# Patient Record
Sex: Male | Born: 1940 | Race: White | Hispanic: No
Health system: Southern US, Community
[De-identification: ages and names within clinical notes are randomized; demographics above are authoritative.]

---

## 2020-04-10 ENCOUNTER — Emergency Department (HOSPITAL_COMMUNITY): Payer: Medicare Other

## 2020-04-10 ENCOUNTER — Other Ambulatory Visit: Payer: Self-pay

## 2020-04-10 ENCOUNTER — Encounter (HOSPITAL_COMMUNITY): Payer: Self-pay | Admitting: Emergency Medicine

## 2020-04-10 ENCOUNTER — Emergency Department (HOSPITAL_COMMUNITY)
Admission: EM | Admit: 2020-04-10 | Discharge: 2020-04-10 | Disposition: A | Discharge status: Home / Self Care | Payer: Medicare Other | Attending: Emergency Medicine | Admitting: Emergency Medicine

## 2020-04-10 ENCOUNTER — Ambulatory Visit (HOSPITAL_COMMUNITY)
Admission: EM | Admit: 2020-04-10 | Discharge: 2020-04-10 | Disposition: A | Discharge status: Other Acute Inpatient Hospital | Payer: Medicare Other

## 2020-04-10 DIAGNOSIS — F0390 Unspecified dementia without behavioral disturbance: Secondary | ICD-10-CM

## 2020-04-10 DIAGNOSIS — J40 Bronchitis, not specified as acute or chronic: Secondary | ICD-10-CM | POA: Diagnosis not present

## 2020-04-10 DIAGNOSIS — Z20822 Contact with and (suspected) exposure to covid-19: Secondary | ICD-10-CM | POA: Insufficient documentation

## 2020-04-10 DIAGNOSIS — F0391 Unspecified dementia with behavioral disturbance: Secondary | ICD-10-CM | POA: Insufficient documentation

## 2020-04-10 DIAGNOSIS — R062 Wheezing: Secondary | ICD-10-CM | POA: Insufficient documentation

## 2020-04-10 DIAGNOSIS — R451 Restlessness and agitation: Secondary | ICD-10-CM | POA: Diagnosis present

## 2020-04-10 LAB — URINALYSIS, ROUTINE W REFLEX MICROSCOPIC
Bacteria, UA: NONE SEEN
Bilirubin Urine: NEGATIVE
Glucose, UA: NEGATIVE mg/dL
Ketones, ur: NEGATIVE mg/dL
Leukocytes,Ua: NEGATIVE
Nitrite: NEGATIVE
Protein, ur: NEGATIVE mg/dL
Specific Gravity, Urine: 1.011 (ref 1.005–1.030)
pH: 5 (ref 5.0–8.0)

## 2020-04-10 LAB — CBC
HCT: 43.7 % (ref 39.0–52.0)
Hemoglobin: 14.6 g/dL (ref 13.0–17.0)
MCH: 30.8 pg (ref 26.0–34.0)
MCHC: 33.4 g/dL (ref 30.0–36.0)
MCV: 92.2 fL (ref 80.0–100.0)
Platelets: 189 10*3/uL (ref 150–400)
RBC: 4.74 MIL/uL (ref 4.22–5.81)
RDW: 12.3 % (ref 11.5–15.5)
WBC: 8.7 10*3/uL (ref 4.0–10.5)
nRBC: 0 % (ref 0.0–0.2)

## 2020-04-10 LAB — COMPREHENSIVE METABOLIC PANEL
ALT: 19 U/L (ref 0–44)
AST: 22 U/L (ref 15–41)
Albumin: 4 g/dL (ref 3.5–5.0)
Alkaline Phosphatase: 39 U/L (ref 38–126)
Anion gap: 10 (ref 5–15)
BUN: 18 mg/dL (ref 8–23)
CO2: 24 mmol/L (ref 22–32)
Calcium: 9.1 mg/dL (ref 8.9–10.3)
Chloride: 102 mmol/L (ref 98–111)
Creatinine, Ser: 1.42 mg/dL — ABNORMAL HIGH (ref 0.61–1.24)
GFR calc Af Amer: 55 mL/min — ABNORMAL LOW (ref >60)
GFR calc non Af Amer: 48 mL/min — ABNORMAL LOW (ref >60)
Glucose, Bld: 109 mg/dL — ABNORMAL HIGH (ref 70–99)
Potassium: 3.9 mmol/L (ref 3.5–5.1)
Sodium: 136 mmol/L (ref 135–145)
Total Bilirubin: 1 mg/dL (ref 0.3–1.2)
Total Protein: 7.2 g/dL (ref 6.5–8.1)

## 2020-04-10 LAB — CBG MONITORING, ED: Glucose-Capillary: 108 mg/dL — ABNORMAL HIGH (ref 70–99)

## 2020-04-10 LAB — SARS CORONAVIRUS 2 BY RT PCR (HOSPITAL ORDER, PERFORMED IN ~~LOC~~ HOSPITAL LAB): SARS Coronavirus 2: NEGATIVE

## 2020-04-10 MED ORDER — AEROCHAMBER PLUS FLO-VU LARGE MISC: 1.0000 | Freq: Once | Status: AC

## 2020-04-10 MED ORDER — SODIUM CHLORIDE 0.9% FLUSH: 3.0000 mL | Freq: Once | INTRAVENOUS | Status: DC

## 2020-04-10 MED ORDER — ALBUTEROL SULFATE HFA 108 (90 BASE) MCG/ACT IN AERS
4.0000 | INHALATION_SPRAY | Freq: Once | RESPIRATORY_TRACT | Status: AC
  Administered 2020-04-10: 4 via RESPIRATORY_TRACT
  Filled 2020-04-10: qty 6.7

## 2020-04-10 MED ORDER — AEROCHAMBER PLUS FLO-VU LARGE MISC
Status: AC
  Administered 2020-04-10: 1
  Filled 2020-04-10: qty 1

## 2020-04-10 NOTE — ED Notes (Signed)
Patient transported to X-ray 

## 2020-04-10 NOTE — ED Provider Notes (Signed)
[facility] EMERGENCY DEPARTMENT Provider Note   CSN: [id number] Arrival date & time: 04/10/20  1336     History Chief Complaint  Patient presents with  . Altered Mental Status    Darren Smith is a 79 y.o. male.  Patient is a 79 year old male with a history of dementia who currently is living with his son presenting today with police.  Patient's son is also present and gives the history.  Son reports that for the last 1 week he has had upper respiratory congestion, cough and has been using Mucinex.  He has the same schedule every day and usually does pretty well.  However today he went out to check the mail and when he came back the door was broken and he was locked out of the house.  He started roaming up the street in the [place] man called the police.  The police brought him back to the house and his son met him there.  However the patient refused to go in the house.  Son reports he became more agitated and stated he did not want to go in the house.  The police asked if he wanted to go to the hospital and he agreed that he wanted to go to the hospital and he did not want to go back into the house.  Son states he has done this 1 other time where he has changed his routine and become agitated and became combative at that time with his son but he has not been aggressive today.  He has not had fever has been eating normally but did skip lunch today.  He has been taking his medications as prescribed.  Patient was seen by his [facility] provider last week and at the [place] for his worsening dementia which has progressed since his wife's sudden death in 09/20/22.  He has a appointment in November with a neurologist but the son does not understand why it so late.  He denies any nausea, vomiting.  The patient states he just does not feel well and he was scared to go in the house because his throat hurt.  He has no prior history of smoking, asthma or COPD.  He does not use inhalers at  home.  He did receive his Covid vaccine.  The history is provided by the patient.  Altered Mental Status Presenting symptoms: behavior changes   Severity:  Moderate Most recent episode:  Today Episode history:  Continuous Duration:  1 day Timing:  Constant Progression:  Unchanged Chronicity:  Recurrent Context: dementia   Associated symptoms: agitation        History reviewed. No pertinent past medical history.  There are no problems to display for this patient.      No family history on file.  Social History   Tobacco Use  . Smoking status: Not on file  Substance Use Topics  . Alcohol use: Not on file  . Drug use: Not on file    Home Medications Prior to Admission medications   Not on File    Allergies    Patient has no allergy information on record.  Review of Systems   Review of Systems  Psychiatric/Behavioral: Positive for agitation.  All other systems reviewed and are negative.   Physical Exam Updated Vital Signs BP (!) 175/109 (BP Location: Right Arm)   Pulse 89   Temp 98 F (36.7 C) (Oral)   Resp 15   SpO2 99%   Physical Exam Vitals and  nursing note reviewed.  Constitutional:      General: He is not in acute distress.    Appearance: Normal appearance. He is well-developed and normal weight.  HENT:     Head: Normocephalic and atraumatic.  Eyes:     Conjunctiva/sclera: Conjunctivae normal.     Pupils: Pupils are equal, round, and reactive to light.  Cardiovascular:     Rate and Rhythm: Normal rate and regular rhythm.     Pulses: Normal pulses.     Heart sounds: No murmur heard.   Pulmonary:     Effort: Pulmonary effort is normal. No respiratory distress.     Breath sounds: Wheezing present. No rales.  Abdominal:     General: There is no distension.     Palpations: Abdomen is soft.     Tenderness: There is no abdominal tenderness. There is no guarding or rebound.  Musculoskeletal:        General: No tenderness. Normal range of  motion.     Cervical back: Normal range of motion and neck supple.  Skin:    General: Skin is warm and dry.     Findings: No erythema or rash.  Neurological:     General: No focal deficit present.     Mental Status: He is alert. Mental status is at baseline.     Comments: Oriented to person and place but not to time   Psychiatric:     Comments: Calm and cooperative     ED Results / Procedures / Treatments   Labs (all labs ordered are listed, but only abnormal results are displayed) Labs Reviewed  COMPREHENSIVE METABOLIC PANEL - Abnormal; Notable for the following components:      Result Value   Glucose, Bld 109 (*)    Creatinine, Ser 1.42 (*)    GFR calc non Af Amer 48 (*)    GFR calc Af Amer 55 (*)    All other components within normal limits  URINALYSIS, ROUTINE W REFLEX MICROSCOPIC - Abnormal; Notable for the following components:   Hgb urine dipstick MODERATE (*)    All other components within normal limits  CBG MONITORING, ED - Abnormal; Notable for the following components:   Glucose-Capillary 108 (*)    All other components within normal limits  SARS CORONAVIRUS 2 BY RT PCR (HOSPITAL ORDER, [facility])  CBC    EKG None  Radiology DG Chest 2 View  Result Date: 04/10/2020 CLINICAL DATA:  Cough and congestion. EXAM: CHEST - 2 VIEW COMPARISON:  None. FINDINGS: Minimal bibasilar opacities likely reflect atelectasis. No pneumothorax or pleural effusion. Cardiomediastinal silhouette within normal limits. Aortic atherosclerotic calcifications. Multilevel spondylosis.  No acute osseous abnormality. IMPRESSION: Minimal bibasilar opacities likely reflect atelectasis. Electronically Signed   By: Primitivo Gauze M.D.   On: 04/10/2020 16:20    Procedures Procedures (including critical care time)  Medications Ordered in ED Medications  sodium chloride flush (NS) 0.9 % injection 3 mL (3 mLs Intravenous Not Given 04/10/20 1552)  albuterol (VENTOLIN  HFA) 108 (90 Base) MCG/ACT inhaler 4 puff (has no administration in time range)  AeroChamber Plus Flo-Vu Medium MISC 1 each (has no administration in time range)    ED Course  I have reviewed the triage vital signs and the nursing notes.  Pertinent labs & imaging results that were available during my care of the patient were reviewed by me and considered in my medical decision making (see chart for details).    MDM Rules/Calculators/A&P  Pt is a 78y/o male with hx of dementia coming today with agitation and recent URI sx.  Pt complains of not feeling good but son reports as above.  He has been receiving mucinex with some improvement.  COVID vaccinated.  Pt having worsening behavioral stuff today.  Pt is calm and cooperative here but son reports he doesn't want to go home.  Pt wheezing on exam but o/w well appearing.  VS shows O2 of 99% on RA and normal pulse but HTN.  Pt has not had home meds today.  Will give albuterol and get CXR/COVID.  Will monitor O2 sat. CBC and BMP unchanged from 1 week ago.  5:26 PM CXR without signs of PNA and after albuterol wheezing has resolved.  Sats remain normal.  Do not find a reason for admission today.  UA and COVID pending but pt now states he wants to go home.  Son asked about observation however feel it was be more detrimental than helpful.  Will have transitional care team come see the son and help with next steps.  Home health ordered.  5:43 PM COVID and UA without acute findings.  MDM Number of Diagnoses or Management Options   Amount and/or Complexity of Data Reviewed Clinical lab tests: ordered and reviewed Tests in the radiology section of CPT: ordered and reviewed Decide to obtain previous medical records or to obtain history from someone other than the patient: yes Obtain history from someone other than the patient: yes Review and summarize past medical records: yes Independent visualization of images, tracings, or  specimens: yes  Risk of Complications, Morbidity, and/or Mortality Presenting problems: moderate Diagnostic procedures: low Management options: low  Patient Progress Patient progress: stable     Final Clinical Impression(s) / ED Diagnoses Final diagnoses:  Bronchitis  Dementia without behavioral disturbance, unspecified dementia type [facility])    Rx / DC Orders ED Discharge Orders    None       Blanchie Dessert, MD 04/10/20 1743

## 2020-04-10 NOTE — ED Triage Notes (Signed)
Pt arrives via [employer] after leaving his home while his son in law was at work and he could not get back In house. Per son in law when he arrives home pt was refuses to go into house. Pt has hx of dementia and family reports this has been worsening since jan when pts wife passed. Pt seems agitated  will not answer any questions for me just says I want to leave.

## 2020-04-10 NOTE — ED Notes (Signed)
Discharge instructions discussed with caregiver Viviann Spare, son. Able to verbalized understanding with no questions at this time. Pt to go home with son

## 2020-04-10 NOTE — Discharge Instructions (Addendum)
Use the inhaler 2puffs with the spacer every 4-6 hours as needed for rattling cough and you can continue mucinex.  Return if you start running a fever, having shortness of breath, vomiting or having new chest or abdominal pain.  COVID test today was negative.

## 2020-04-10 NOTE — ED Notes (Signed)
CM at bedside

## 2021-05-30 IMAGING — CR DG CHEST 2V
2 series · 2 of 2 positions shown · non-contrast
Comparison: None.

CLINICAL DATA: Cough and congestion.

EXAM:
CHEST - 2 VIEW

[chest lat]
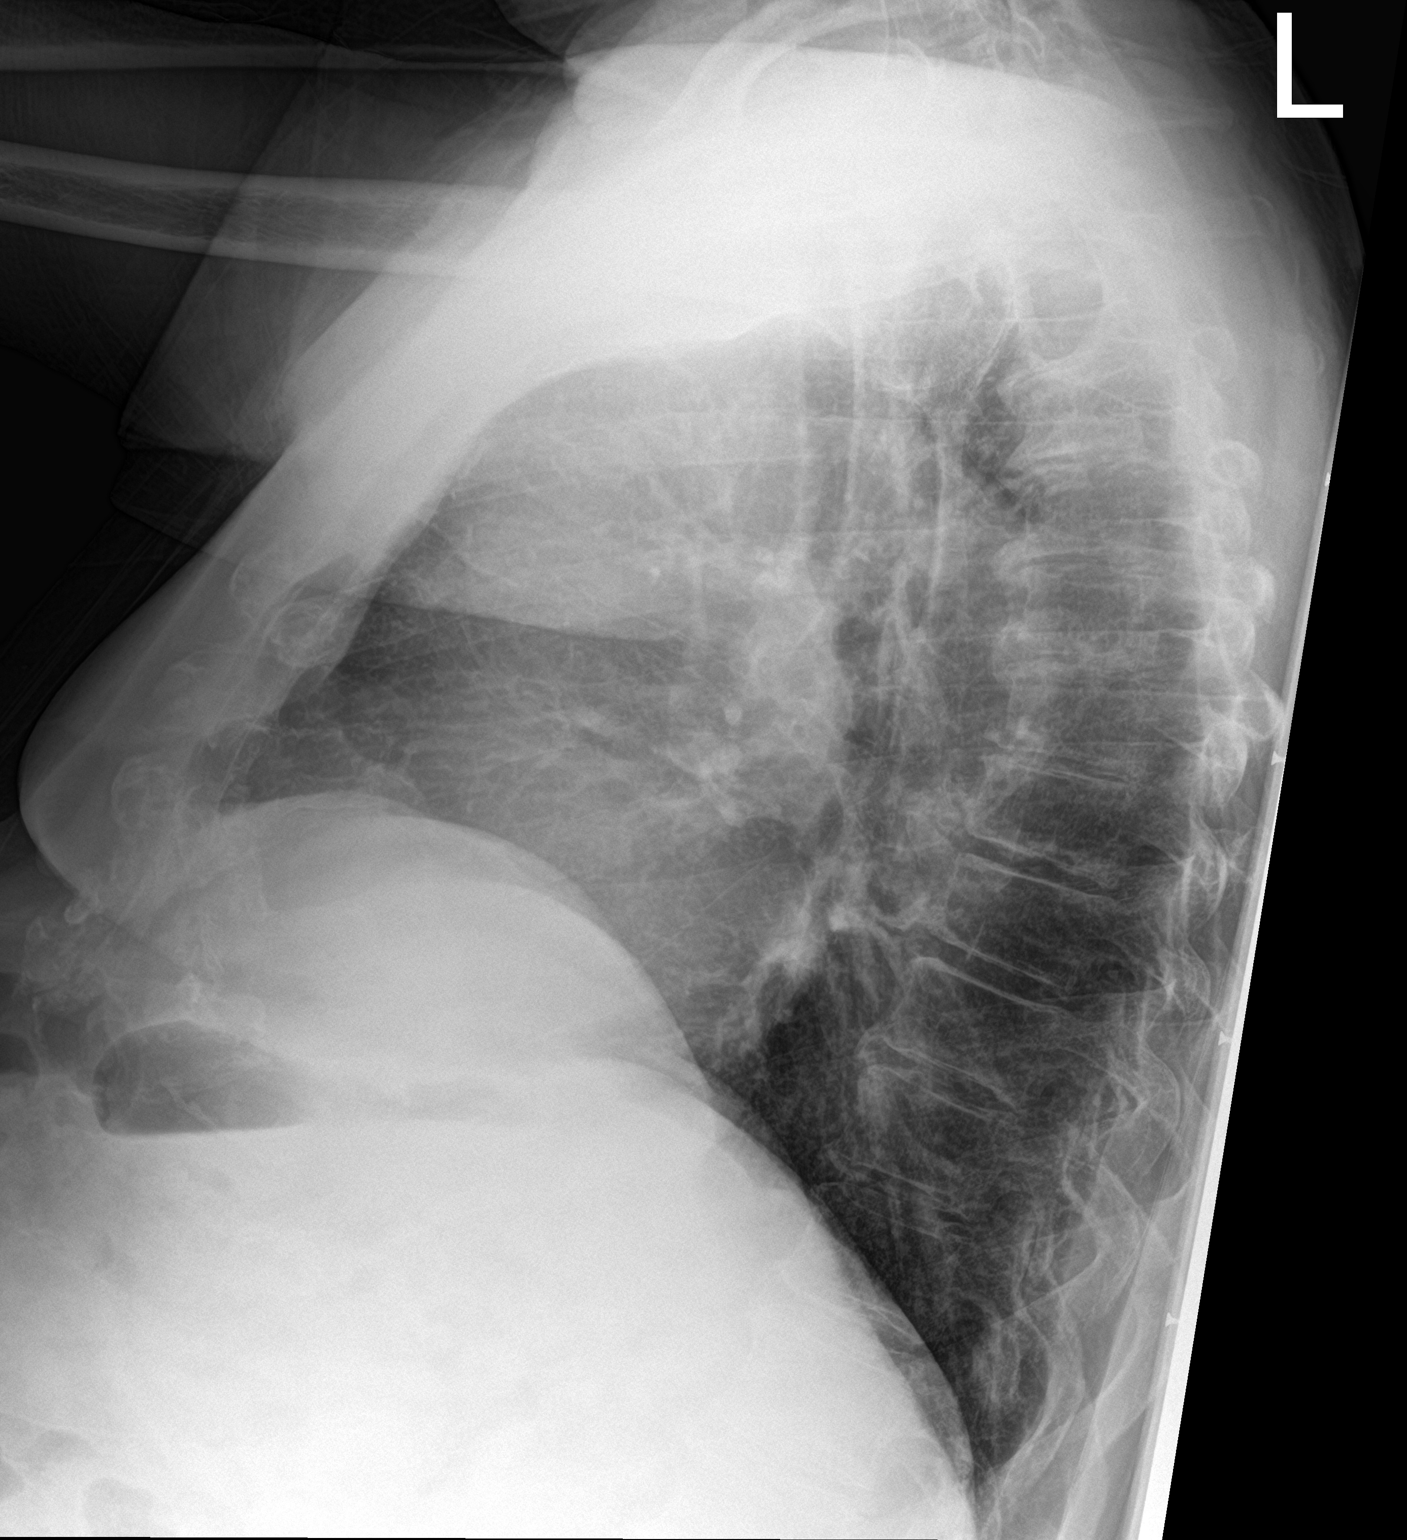

[chest ap]
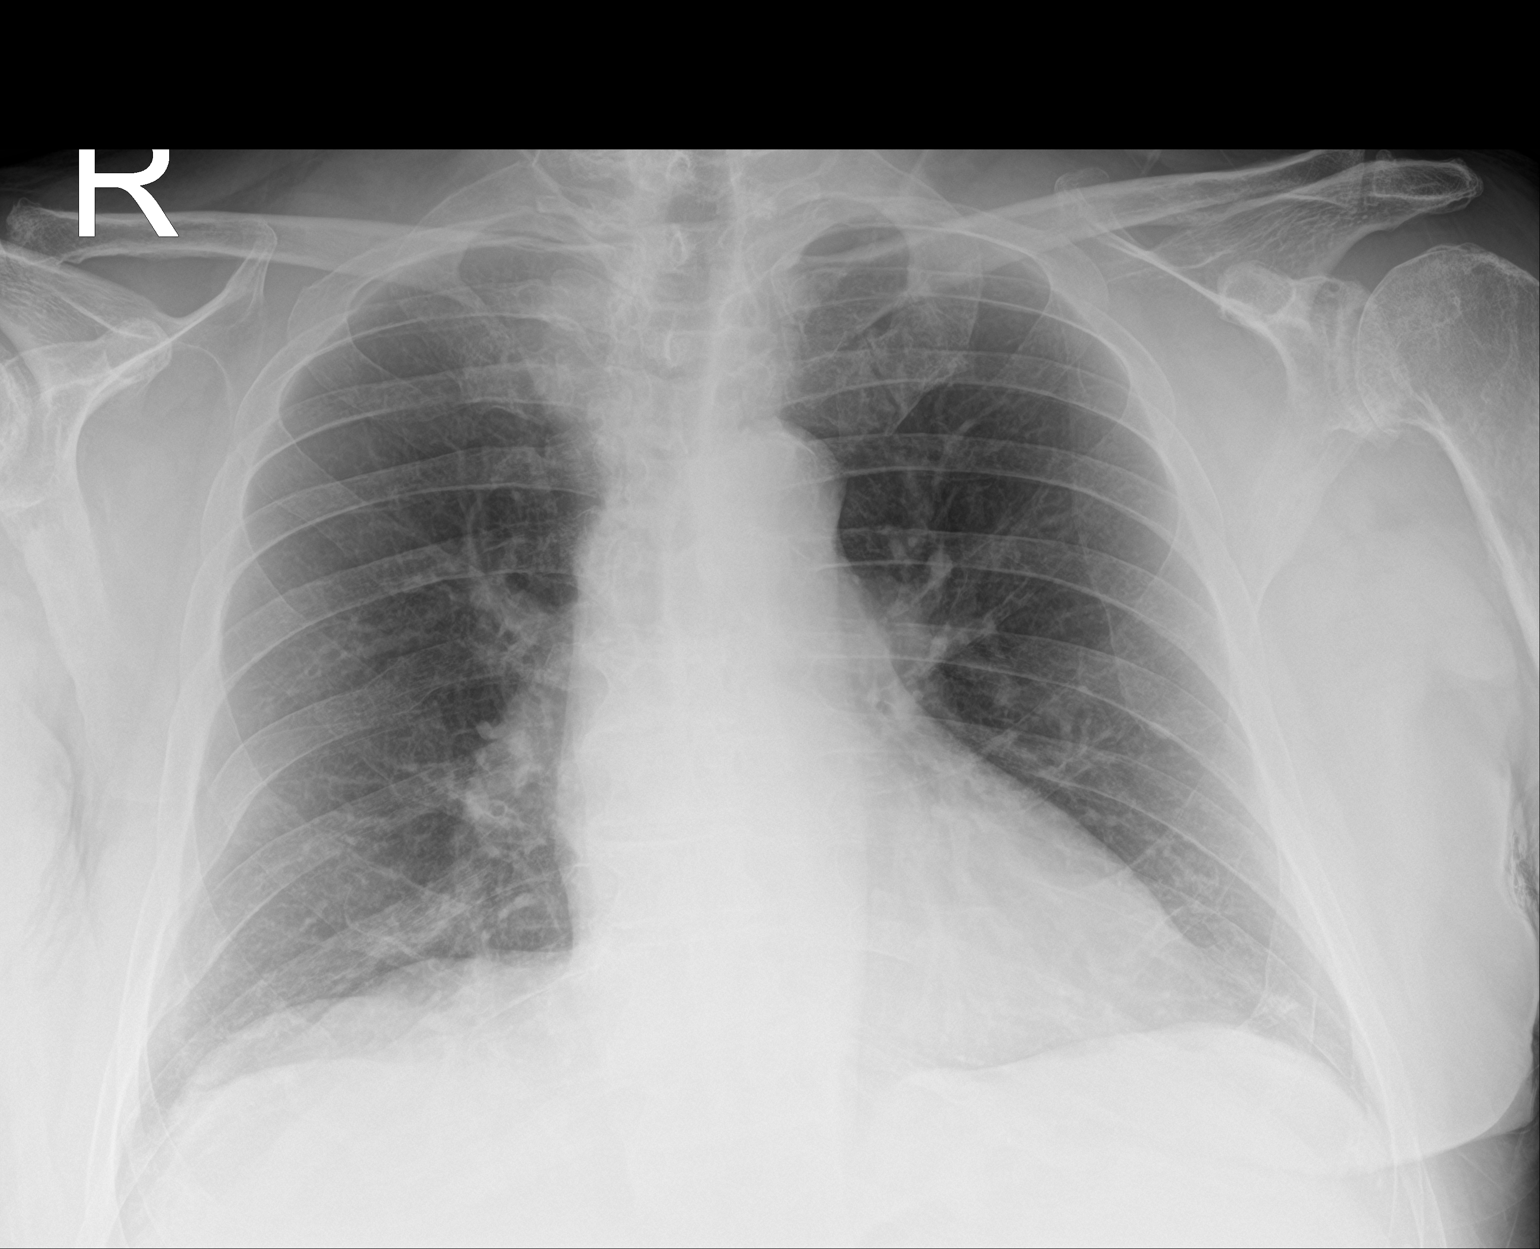

[2 of 2 positions shown; findings below may reference images not displayed]

FINDINGS: Minimal bibasilar opacities likely reflect atelectasis. No
pneumothorax or pleural effusion. Cardiomediastinal silhouette
within normal limits. Aortic atherosclerotic calcifications.

Multilevel spondylosis.  No acute osseous abnormality.
IMPRESSION: Minimal bibasilar opacities likely reflect atelectasis.
